# Patient Record
Sex: Female | Born: 1990 | Race: White | Hispanic: No | Marital: Single | Smoking: Never smoker
Health system: Southern US, Community
[De-identification: ages and names within clinical notes are randomized; demographics above are authoritative.]

## PROBLEM LIST (undated history)

## (undated) ENCOUNTER — Inpatient Hospital Stay (HOSPITAL_COMMUNITY): Payer: Self-pay

## (undated) DIAGNOSIS — F32A Depression, unspecified: Secondary | ICD-10-CM

## (undated) DIAGNOSIS — T7840XA Allergy, unspecified, initial encounter: Secondary | ICD-10-CM

## (undated) DIAGNOSIS — F329 Major depressive disorder, single episode, unspecified: Secondary | ICD-10-CM

## (undated) DIAGNOSIS — F419 Anxiety disorder, unspecified: Secondary | ICD-10-CM

## (undated) HISTORY — DX: Allergy, unspecified, initial encounter: T78.40XA

## (undated) HISTORY — DX: Depression, unspecified: F32.A

## (undated) HISTORY — PX: APPENDECTOMY: SHX54

## (undated) HISTORY — DX: Major depressive disorder, single episode, unspecified: F32.9

## (undated) HISTORY — DX: Anxiety disorder, unspecified: F41.9

---
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
## 2013-06-15 ENCOUNTER — Ambulatory Visit (INDEPENDENT_AMBULATORY_CARE_PROVIDER_SITE_OTHER): Payer: PRIVATE HEALTH INSURANCE | Admitting: Internal Medicine

## 2013-06-15 VITALS — BP 106/62 | HR 79 | Temp 97.9°F | Resp 16 | Ht 62.5 in | Wt 115.2 lb

## 2013-06-15 DIAGNOSIS — N912 Amenorrhea, unspecified: Secondary | ICD-10-CM

## 2013-06-15 DIAGNOSIS — F3289 Other specified depressive episodes: Secondary | ICD-10-CM

## 2013-06-15 DIAGNOSIS — F329 Major depressive disorder, single episode, unspecified: Secondary | ICD-10-CM | POA: Insufficient documentation

## 2013-06-15 LAB — POCT CBC
Granulocyte percent: 63.4 %G (ref 37–80)
HCT, POC: 39.9 % (ref 37.7–47.9)
HEMOGLOBIN: 13.4 g/dL (ref 12.2–16.2)
LYMPH, POC: 2.8 (ref 0.6–3.4)
MCH, POC: 29.6 pg (ref 27–31.2)
MCHC: 33.6 g/dL (ref 31.8–35.4)
MCV: 88.3 fL (ref 80–97)
MID (cbc): 0.6 (ref 0–0.9)
MPV: 9.9 fL (ref 0–99.8)
PLATELET COUNT, POC: 218 10*3/uL (ref 142–424)
POC Granulocyte: 5.9 (ref 2–6.9)
POC LYMPH %: 29.7 % (ref 10–50)
POC MID %: 6.9 % (ref 0–12)
RBC: 4.52 M/uL (ref 4.04–5.48)
RDW, POC: 12.4 %
WBC: 9.3 10*3/uL (ref 4.6–10.2)

## 2013-06-15 MED ORDER — PRENATAL VITAMINS 28-0.8 MG PO TABS: 1.0000 | ORAL_TABLET | Freq: Every day | ORAL | Status: DC

## 2013-06-15 NOTE — Patient Instructions (Signed)
SNRI/SSRI exposure on infant development and behavior are not known  The ACOG recommends that therapy with SSRIs or SNRIs during pregnancy be individualized; treatment of depression during pregnancy should incorporate the clinical expertise of the mental health clinician, obstetrician, primary healthc are provider, and pediatrician. According to the American Psychiatric Association (APA), the risks of medication treatment should be weighed against other treatment options and untreated depression. For women who discontinue antidepressant medications during pregnancy and who may be at high risk for postpartum depression, the medications can be restarted following delivery. Treatment algorithms have been developed by the ACOG and the APA for the management of depression in women prior to conception and during pregnancy  Health care providers are encouraged to enroll women exposed to duloxetine during pregnancy in the Cymbalta Pregnancy Registry [phone] or http://cymbaltapregnancyregistry.com). </P>

## 2013-06-15 NOTE — Progress Notes (Signed)
   Subjective:    Patient ID: Andrea Pham, female    DOB: 07/22/1990, 23 y.o.   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  Only medication is Cymbalta which she has been on for depression for 5 years No recent symptoms    Review of Systems Noncontributory    Objective:   Physical Exam BP 106/62  Pulse 79  Temp(Src) 97.9 F (36.6 C) (Oral)  Resp 16  Ht 5' 2.5" (1.588 m)  Wt 115 lb 3.2 oz (52.254 kg)  BMI 20.72 kg/m2  SpO2 98%  LMP 04/17/2013 Abdomen soft nontender without palpable mass in the suprapubic area/no tenderness to exam       Results for orders placed in visit on 06/15/13  POCT CBC      Result Value Ref Range   WBC 9.3  4.6 - 10.2 K/uL   Lymph, poc 2.8  0.6 - 3.4   POC LYMPH PERCENT 29.7  10 - 50 %L   MID (cbc) 0.6  0 - 0.9   POC MID % 6.9  0 - 12 %M   POC Granulocyte 5.9  2 - 6.9   Granulocyte percent 63.4  37 - 80 %G   RBC 4.52  4.04 - 5.48 M/uL   Hemoglobin 13.4  12.2 - 16.2 g/dL   HCT, POC 46.939.9  62.937.7 - 47.9 %   MCV 88.3  80 - 97 fL   MCH, POC 29.6  27 - 31.2 pg   MCHC 33.6  31.8 - 35.4 g/dL   RDW, POC 52.812.4     Platelet Count, POC 218  142 - 424 K/uL   MPV 9.9  0 - 99.8 fL    Assessment & Plan:  Amenorrhea - Plan: hCG, quantitative, pregnancy, POCT CBC  She would like a serum test to try to quantify her hormone and see if a date is more predictable-  She is advised that only an ultrasound would determine her dates  Prenatal vitamins started  [place]-she will decide who  Depressive disorder, not elsewhere classified  I recommend discontinuing Cymbalta at this point due to her lack of symptoms and her length of time on the    medication at such a low dose

## 2013-06-16 ENCOUNTER — Telehealth: Payer: Self-pay | Admitting: Internal Medicine

## 2013-06-16 LAB — HCG, QUANTITATIVE, PREGNANCY: hCG, Beta Chain, Quant, S: 43536.3 m[IU]/mL

## 2013-06-16 NOTE — Telephone Encounter (Signed)
hcg results conveyed---has ob appt in ws tomorrow

## 2013-06-18 ENCOUNTER — Inpatient Hospital Stay (HOSPITAL_COMMUNITY): Payer: PRIVATE HEALTH INSURANCE

## 2013-06-18 ENCOUNTER — Inpatient Hospital Stay (HOSPITAL_COMMUNITY)
Admission: AD | Admit: 2013-06-18 | Discharge: 2013-06-18 | Disposition: A | Discharge status: Home / Self Care | Payer: PRIVATE HEALTH INSURANCE | Source: Ambulatory Visit | Attending: Obstetrics and Gynecology | Admitting: Obstetrics and Gynecology

## 2013-06-18 ENCOUNTER — Encounter (HOSPITAL_COMMUNITY): Payer: Self-pay | Admitting: *Deleted

## 2013-06-18 DIAGNOSIS — O209 Hemorrhage in early pregnancy, unspecified: Secondary | ICD-10-CM

## 2013-06-18 DIAGNOSIS — R110 Nausea: Secondary | ICD-10-CM

## 2013-06-18 DIAGNOSIS — R319 Hematuria, unspecified: Secondary | ICD-10-CM | POA: Insufficient documentation

## 2013-06-18 DIAGNOSIS — R109 Unspecified abdominal pain: Secondary | ICD-10-CM | POA: Insufficient documentation

## 2013-06-18 DIAGNOSIS — O210 Mild hyperemesis gravidarum: Secondary | ICD-10-CM | POA: Insufficient documentation

## 2013-06-18 LAB — URINE MICROSCOPIC-ADD ON

## 2013-06-18 LAB — WET PREP, GENITAL
Clue Cells Wet Prep HPF POC: NONE SEEN
Trich, Wet Prep: NONE SEEN
Yeast Wet Prep HPF POC: NONE SEEN

## 2013-06-18 LAB — URINALYSIS, ROUTINE W REFLEX MICROSCOPIC
Bilirubin Urine: NEGATIVE
Glucose, UA: NEGATIVE mg/dL
Ketones, ur: NEGATIVE mg/dL
Leukocytes, UA: NEGATIVE
Nitrite: NEGATIVE
PROTEIN: NEGATIVE mg/dL
SPECIFIC GRAVITY, URINE: 1.02 (ref 1.005–1.030)
Urobilinogen, UA: 0.2 mg/dL (ref 0.0–1.0)
pH: 5.5 (ref 5.0–8.0)

## 2013-06-18 LAB — HCG, QUANTITATIVE, PREGNANCY: hCG, Beta Chain, Quant, S: 46942 m[IU]/mL — ABNORMAL HIGH (ref <5)

## 2013-06-18 MED ORDER — PROMETHAZINE HCL 25 MG PO TABS
25.0000 mg | ORAL_TABLET | Freq: Once | ORAL | Status: AC
  Administered 2013-06-18: 25 mg via ORAL
  Filled 2013-06-18: qty 1

## 2013-06-18 MED ORDER — PROMETHAZINE HCL 25 MG PO TABS: 25.0000 mg | ORAL_TABLET | Freq: Four times a day (QID) | ORAL | Status: DC | PRN

## 2013-06-18 NOTE — MAU Note (Signed)
Pt presents with complaints of light amount of vaginal bleeding that is pink in color that started today around 5 today. States some mild cramping.

## 2013-06-18 NOTE — MAU Provider Note (Signed)
History     CSN: [id number]  Arrival date and time: 06/18/13 [id number]   First Provider Initiated Contact with Patient 06/18/13 1846      Chief Complaint  Patient presents with  . Vaginal Bleeding   HPI Comments: Andrea Pham Andrea Pham 23 y.o. G1P0 presents to MAU with cramping and vaginal bleeding starting today. She noticed it in her under ware. She went to urgent Care on 3/24 and BHCG was 43,536. She did have large blood in urine and no culture was done and she does not have any sx of UTI. She is complaining of nausea. She was put on Diclegis and it is not helping.    Vaginal Bleeding Associated symptoms include abdominal pain and nausea.      Past Medical History  Diagnosis Date  . Allergy   . Anxiety   . Depression     Past Surgical History  Procedure Laterality Date  . Appendectomy      Family History  Problem Relation Age of Onset  . Heart disease Father     History  Substance Use Topics  . Smoking status: Never Smoker   . Smokeless tobacco: Not on file  . Alcohol Use: No    Allergies: No Known Allergies  Prescriptions prior to admission  Medication Sig Dispense Refill  . DULoxetine (CYMBALTA) 30 MG capsule Take 30 mg by mouth daily.      . Prenatal Vit-Fe Fumarate-FA (PRENATAL VITAMINS) 28-0.8 MG TABS Take 1 tablet by mouth daily.  30 tablet  9    Review of Systems  Constitutional: Negative.   HENT: Negative.   Eyes: Negative.   Cardiovascular: Negative.   Gastrointestinal: Positive for nausea and abdominal pain.  Genitourinary: Negative.        Cramping and vaginal bleeding  Musculoskeletal: Negative.   Skin: Negative.   Neurological: Negative.   Psychiatric/Behavioral: Negative.    Physical Exam   Blood pressure 106/57, pulse 74, temperature 98.9 F (37.2 C), resp. rate 18, height 5\' 2"  (1.575 m), weight 118 lb (53.524 kg), last menstrual period 04/08/2013.  Physical Exam  Constitutional: She is oriented to person, place, and time. She  appears well-developed and well-nourished. No distress.  HENT:  Head: Normocephalic.  Eyes: Pupils are equal, round, and reactive to light.  Neck: Normal range of motion.  Cardiovascular: Normal rate, regular rhythm and normal heart sounds.   Respiratory: Effort normal and breath sounds normal.  GI: Soft. Bowel sounds are normal. She exhibits no distension and no mass. There is no tenderness. There is no rebound and no guarding.  Genitourinary:  Genital:external negative Vaginal: small amount brown blood Cervix:closed/ long Bimanual:nontender uterus   Musculoskeletal: Normal range of motion.  Neurological: She is alert and oriented to person, place, and time.  Skin: Skin is warm and dry.  Psychiatric: She has a normal mood and affect. Her behavior is normal. Judgment and thought content normal.   Results for orders placed during the hospital encounter of 06/18/13 (from the past 24 hour(s))  URINALYSIS, ROUTINE W REFLEX MICROSCOPIC     Status: Abnormal   Collection Time    06/18/13  6:15 PM      Result Value Ref Range   Color, Urine YELLOW  YELLOW   APPearance CLEAR  CLEAR   Specific Gravity, Urine 1.020  1.005 - 1.030   pH 5.5  5.0 - 8.0   Glucose, UA NEGATIVE  NEGATIVE mg/dL   Hgb urine dipstick LARGE (*) NEGATIVE   Bilirubin Urine NEGATIVE  NEGATIVE   Ketones, ur NEGATIVE  NEGATIVE mg/dL   Protein, ur NEGATIVE  NEGATIVE mg/dL   Urobilinogen, UA 0.2  0.0 - 1.0 mg/dL   Nitrite NEGATIVE  NEGATIVE   Leukocytes, UA NEGATIVE  NEGATIVE  URINE MICROSCOPIC-ADD ON     Status: Abnormal   Collection Time    06/18/13  6:15 PM      Result Value Ref Range   Squamous Epithelial / LPF FEW (*) RARE   WBC, UA 0-2  <3 WBC/hpf   RBC / HPF 7-10  <3 RBC/hpf   Bacteria, UA FEW (*) RARE  WET PREP, GENITAL     Status: Abnormal   Collection Time    06/18/13  7:01 PM      Result Value Ref Range   Yeast Wet Prep HPF POC NONE SEEN  NONE SEEN   Trich, Wet Prep NONE SEEN  NONE SEEN   Clue Cells  Wet Prep HPF POC NONE SEEN  NONE SEEN   WBC, Wet Prep HPF POC MODERATE (*) NONE SEEN  HCG, QUANTITATIVE, PREGNANCY     Status: Abnormal   Collection Time    06/18/13  7:08 PM      Result Value Ref Range   hCG, Beta Chain, Sharene Butters, [place] [address] (*) <5 mIU/mL  ABO/RH     Status: None   Collection Time    06/18/13  7:08 PM      Result Value Ref Range   ABO/RH(D) O POS     US Ob Comp Less 14 Wks  06/18/2013   CLINICAL DATA:  Pregnant, vaginal bleeding  EXAM: OBSTETRIC <14 WK ULTRASOUND  TECHNIQUE: Transabdominal ultrasound was performed for evaluation of the gestation as well as the maternal uterus and adnexal regions.  COMPARISON:  None.  FINDINGS: Intrauterine gestational sac: Visualized/normal in shape.  Yolk sac:  Present  Embryo:  Present  Cardiac Activity: Present  Heart Rate: 130 bpm  CRL:   4.7  mm   6 w 2 d                  [place] EDC: 02/09/2014  Maternal uterus/adnexae: No subchorionic hemorrhage.  Bilateral ovaries are within normal limits, noting a suspected small left corpus luteal cyst.  No pelvic fluid.  IMPRESSION: Single live intrauterine gestation with estimated gestational age [redacted] weeks 2 days by crown-rump length.   Electronically Signed   By: Charline Bills M.D.   On: 06/18/2013 19:49    MAU Course  Procedures  MDM Wet prep, GC, Chlamydia, , UA, U/S, ABORh, Quant Spoke with Dr Marcelle Overlie and advised to give miscarriage precautions   Assessment and Plan   Bleeding in early pregnancy Nausea in pregnancy Phenergan 25 mg po q 6 hours prn nausea Follow up in office as indicated  [name] 06/18/2013, 7:06 PM

## 2013-06-18 NOTE — Discharge Instructions (Signed)
Nausea and Vomiting Nausea means you feel sick to your stomach. Throwing up (vomiting) is a reflex where stomach contents come out of your mouth. HOME CARE   Take medicine as told by your doctor.  Do not force yourself to eat. However, you do need to drink fluids.  If you feel like eating, eat a normal diet as told by your doctor.  Eat rice, wheat, potatoes, bread, lean meats, yogurt, fruits, and vegetables.  Avoid high-fat foods.  Drink enough fluids to keep your pee (urine) clear or pale yellow.  Ask your doctor how to replace body fluid losses (rehydrate). Signs of body fluid loss (dehydration) include:  Feeling very thirsty.  Dry lips and mouth.  Feeling dizzy.  Dark pee.  Peeing less than normal.  Feeling confused.  Fast breathing or heart rate. GET HELP RIGHT AWAY IF:   You have blood in your throw up.  You have black or bloody poop (stool).  You have a bad headache or stiff neck.  You feel confused.  You have bad belly (abdominal) pain.  You have chest pain or trouble breathing.  You do not pee at least once every 8 hours.  You have cold, clammy skin.  You keep throwing up after 24 to 48 hours.  You have a fever. MAKE SURE YOU:   Understand these instructions.  Will watch your condition.  Will get help right away if you are not doing well or get worse. Document Released: 08/28/2007 Document Revised: 06/03/2011 Document Reviewed: 08/10/2010 [facility] Patient Information 2014 [place]. Miscarriage A miscarriage is the sudden loss of an unborn baby (fetus) before the 20th week of pregnancy. Most miscarriages happen in the first 3 months of pregnancy. Sometimes, it happens before a woman even knows she is pregnant. A miscarriage is also called a "spontaneous miscarriage" or "early pregnancy loss." Having a miscarriage can be an emotional experience. Talk with your caregiver about any questions you may have about miscarrying, the grieving  process, and your future pregnancy plans. CAUSES   Problems with the fetal chromosomes that make it impossible for the baby to develop normally. Problems with the baby's genes or chromosomes are most often the result of errors that occur, by chance, as the embryo divides and grows. The problems are not inherited from the parents.  Infection of the cervix or uterus.   Hormone problems.   Problems with the cervix, such as having an incompetent cervix. This is when the tissue in the cervix is not strong enough to hold the pregnancy.   Problems with the uterus, such as an abnormally shaped uterus, uterine fibroids, or congenital abnormalities.   Certain medical conditions.   Smoking, drinking alcohol, or taking illegal drugs.   Trauma.  Often, the cause of a miscarriage is unknown.  SYMPTOMS   Vaginal bleeding or spotting, with or without cramps or pain.  Pain or cramping in the abdomen or lower back.  Passing fluid, tissue, or blood clots from the vagina. DIAGNOSIS  Your caregiver will perform a physical exam. You may also have an ultrasound to confirm the miscarriage. Blood or urine tests may also be ordered. TREATMENT   Sometimes, treatment is not necessary if you naturally pass all the fetal tissue that was in the uterus. If some of the fetus or placenta remains in the body (incomplete miscarriage), tissue left behind may become infected and must be removed. Usually, a dilation and curettage (D and C) procedure is performed. During a D and C procedure,  the cervix is widened (dilated) and any remaining fetal or placental tissue is gently removed from the uterus.  Antibiotic medicines are prescribed if there is an infection. Other medicines may be given to reduce the size of the uterus (contract) if there is a lot of bleeding.  If you have Rh negative blood and your baby was Rh positive, you will need a Rh immunoglobulin shot. This shot will protect any future baby from having  Rh blood problems in future pregnancies. HOME CARE INSTRUCTIONS   Your caregiver may order bed rest or may allow you to continue light activity. Resume activity as directed by your caregiver.  Have someone help with home and family responsibilities during this time.   Keep track of the number of sanitary pads you use each day and how soaked (saturated) they are. Write down this information.   Do not use tampons. Do not douche or have sexual intercourse until approved by your caregiver.   Only take over-the-counter or prescription medicines for pain or discomfort as directed by your caregiver.   Do not take aspirin. Aspirin can cause bleeding.   Keep all follow-up appointments with your caregiver.   If you or your partner have problems with grieving, talk to your caregiver or seek counseling to help cope with the pregnancy loss. Allow enough time to grieve before trying to get pregnant again.  SEEK IMMEDIATE MEDICAL CARE IF:   You have severe cramps or pain in your back or abdomen.  You have a fever.  You pass large blood clots (walnut-sized or larger) ortissue from your vagina. Save any tissue for your caregiver to inspect.   Your bleeding increases.   You have a thick, bad-smelling vaginal discharge.  You become lightheaded, weak, or you faint.   You have chills.  MAKE SURE YOU:  Understand these instructions.  Will watch your condition.  Will get help right away if you are not doing well or get worse. Document Released: 09/04/2000 Document Revised: 07/06/2012 Document Reviewed: 04/30/2011 [facility] Patient Information 2014 [place], [place].

## 2013-06-19 LAB — ABO/RH: ABO/RH(D): O POS

## 2013-06-19 LAB — GC/CHLAMYDIA PROBE AMP
CT PROBE, AMP APTIMA: NEGATIVE
GC Probe RNA: NEGATIVE

## 2013-06-20 LAB — URINE CULTURE

## 2014-01-24 ENCOUNTER — Encounter (HOSPITAL_COMMUNITY): Payer: Self-pay | Admitting: *Deleted

## 2014-04-23 ENCOUNTER — Encounter (HOSPITAL_COMMUNITY): Payer: Self-pay | Admitting: *Deleted

## 2014-10-27 IMAGING — US US OB COMP LESS 14 WK
1 series · 14 of 26 positions shown · non-contrast
Comparison: None.

CLINICAL DATA: Pregnant, vaginal bleeding

EXAM:
OBSTETRIC <14 WK ULTRASOUND
TECHNIQUE: Transabdominal ultrasound was performed for evaluation of the
gestation as well as the maternal uterus and adnexal regions.

[Series 1: us ob comp less 14 wks · 26 acquisitions, 14 frames shown]
[im 1/26]
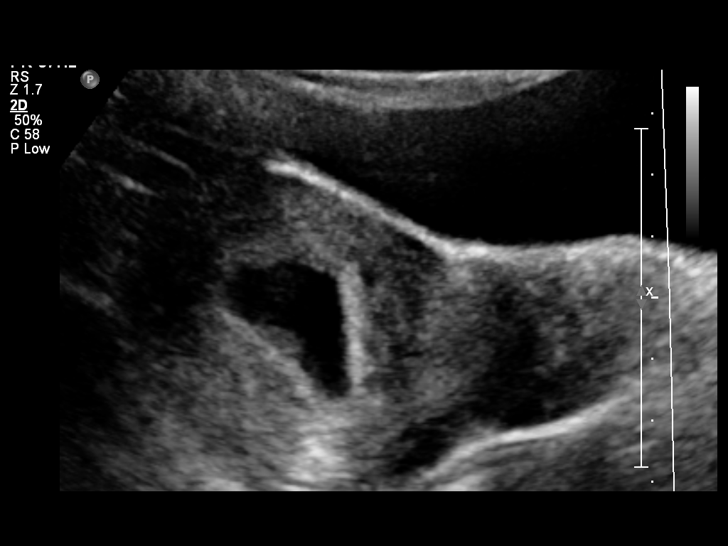
[im 3/26]
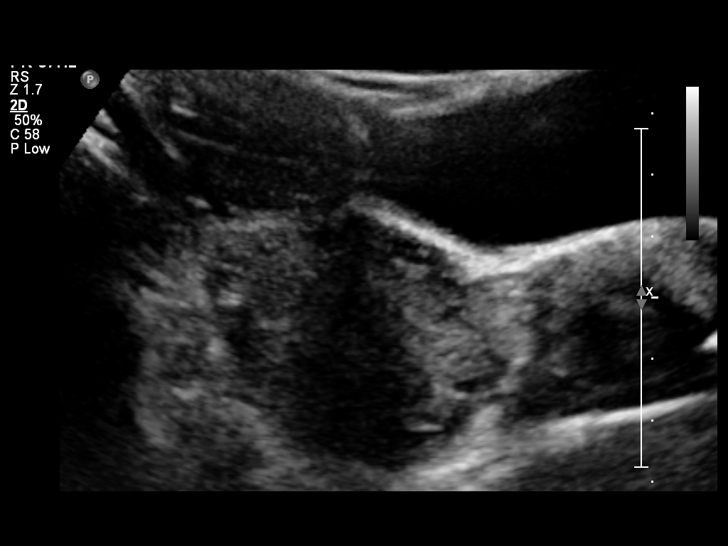
[im 5/26]
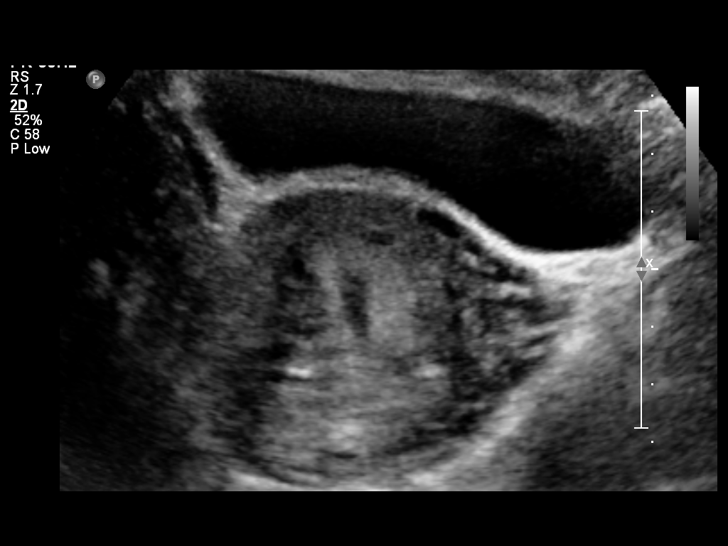
[im 7/26]
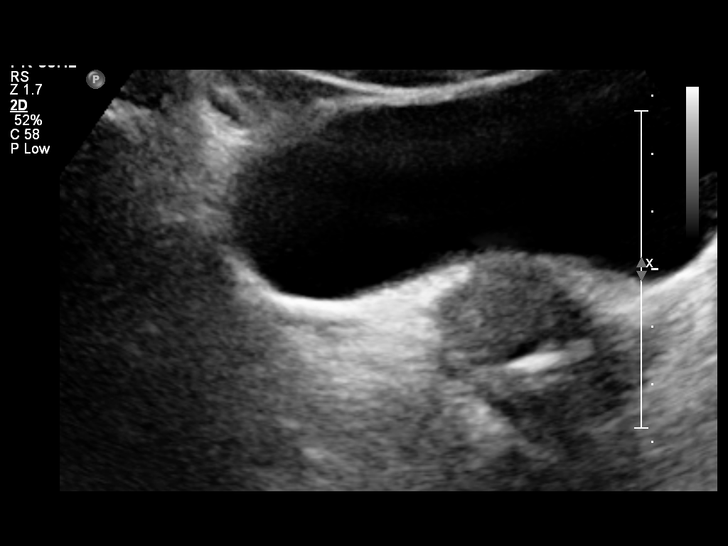
[im 9/26]
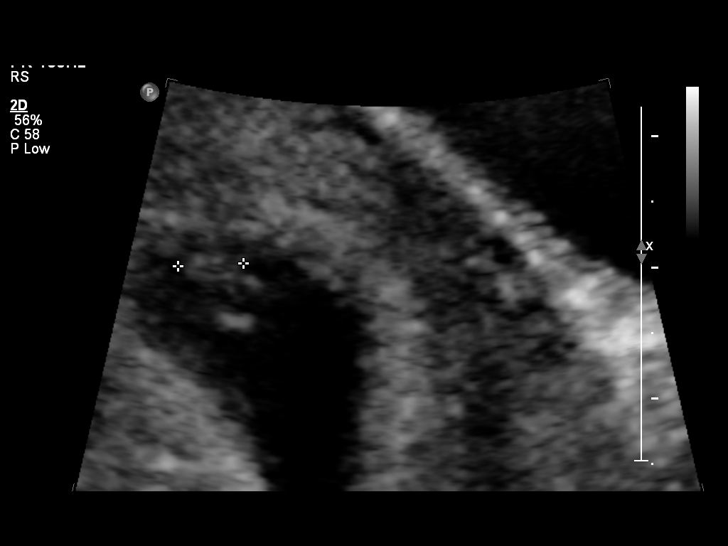
[im 11/26]
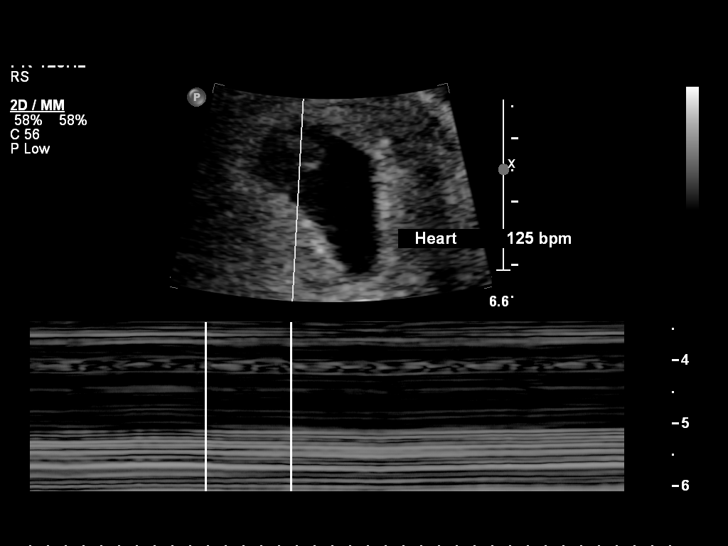
[im 13/26]
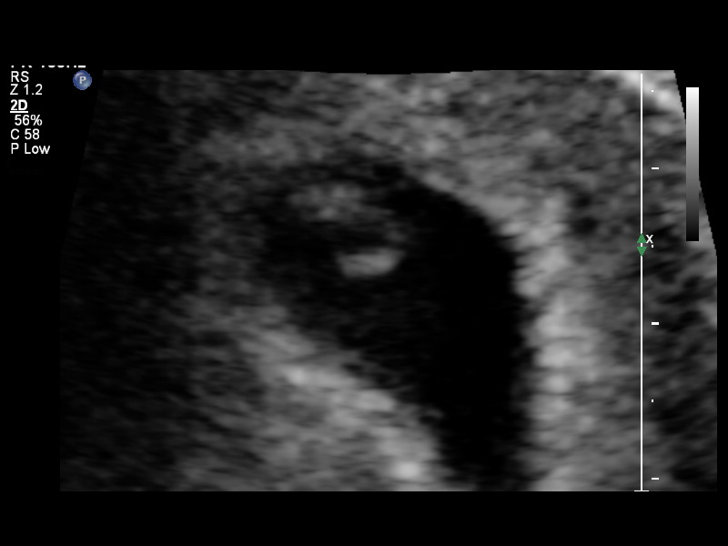
[im 14/26]
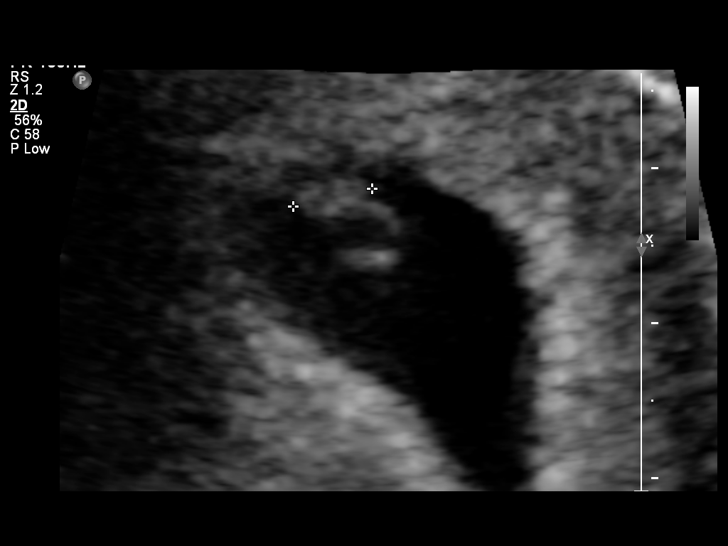
[im 16/26]
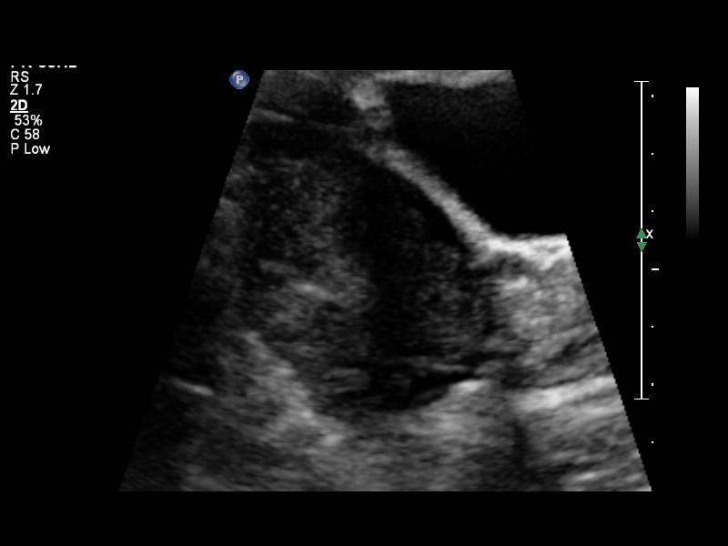
[im 18/26]
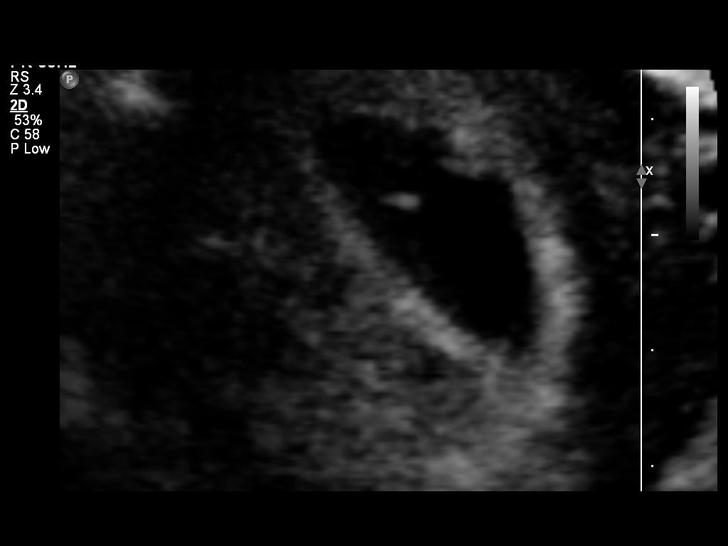
[im 20/26]
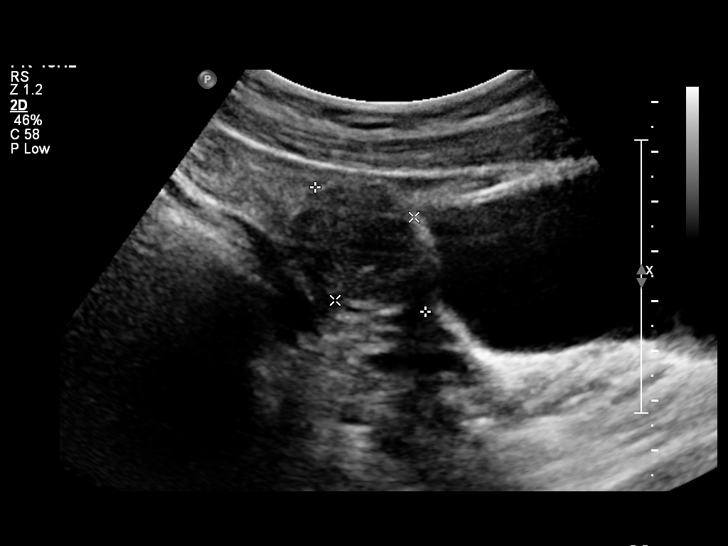
[im 22/26]
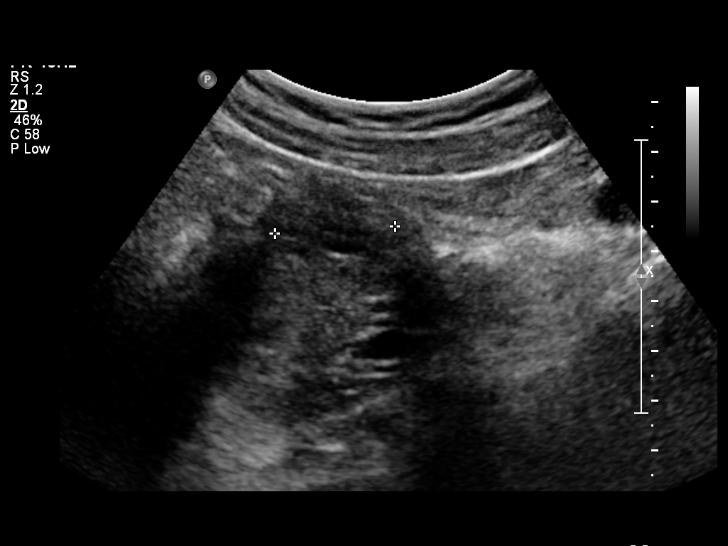
[im 24/26]
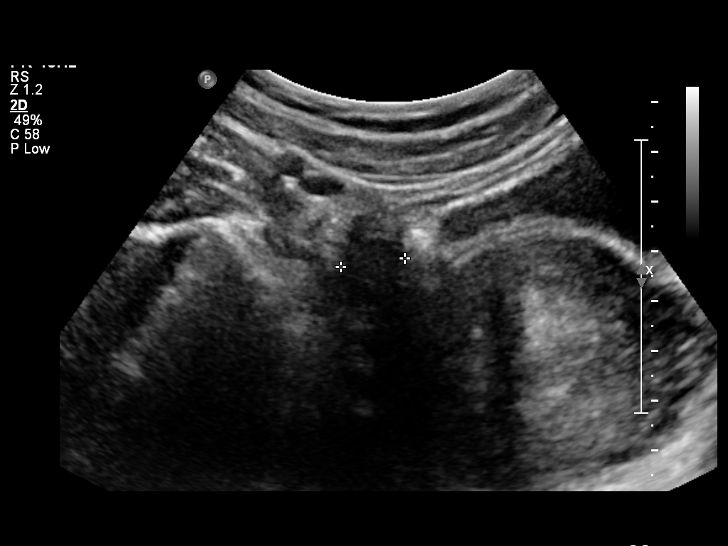
[im 26/26]
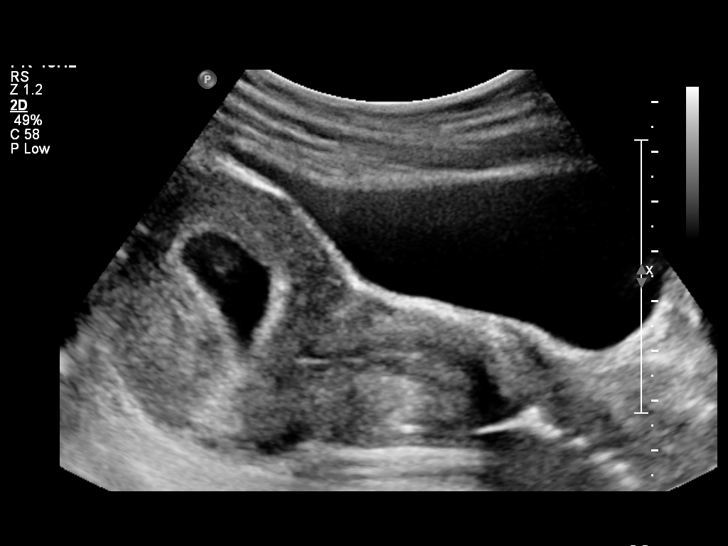

[14 of 26 positions shown; findings below may reference images not displayed]

FINDINGS: Intrauterine gestational sac: Visualized/normal in shape.

Yolk sac:  Present

Embryo:  Present

Cardiac Activity: Present

Heart Rate: 130 bpm

CRL:   4.7  mm   6 w 2 d                  US EDC: 02/09/2014

Maternal uterus/adnexae: No subchorionic hemorrhage.

Bilateral ovaries are within normal limits, noting a suspected small
left corpus luteal cyst.

No pelvic fluid.
IMPRESSION: Single live intrauterine gestation with estimated gestational age 6
weeks 2 days by crown-rump length.

## 2015-07-11 ENCOUNTER — Ambulatory Visit (INDEPENDENT_AMBULATORY_CARE_PROVIDER_SITE_OTHER): Payer: PRIVATE HEALTH INSURANCE | Admitting: Physician Assistant

## 2015-07-11 VITALS — BP 98/64 | HR 68 | Temp 98.6°F | Resp 16 | Ht 63.0 in | Wt 113.0 lb

## 2015-07-11 DIAGNOSIS — Z7184 Encounter for health counseling related to travel: Secondary | ICD-10-CM

## 2015-07-11 DIAGNOSIS — Z7189 Other specified counseling: Secondary | ICD-10-CM | POA: Diagnosis not present

## 2015-07-11 MED ORDER — DOXYCYCLINE HYCLATE 100 MG PO TABS: 100.0000 mg | ORAL_TABLET | Freq: Every day | ORAL | Status: AC

## 2015-07-11 NOTE — Patient Instructions (Addendum)
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 Please contact [facility] at [phone] with questions or concerns regarding your invoice.   IF you received labwork today, you will receive an invoice from [facility]. Please contact [facility] at [phone] with questions or concerns regarding your invoice.   Our billing staff will not be able to assist you with questions regarding bills from these companies.  You will be contacted with the lab results as soon as they are available. The fastest way to get your results is to activate your My Chart account. Instructions are located on the last page of this paperwork. If you have not heard from us regarding the results in 2 weeks, please contact this office.

## 2015-07-11 NOTE — Progress Notes (Addendum)
   Andrea Pham  MRN: [id number] DOB: 03/20/1991  Subjective:  Pt presents to clinic for medical advice on travel immunization - She is leaving in 2 days for [place] to visit her boyfriend - she is going to be there for about 3 months - she is UTD on her vaccines - [place] [employer] [place] [employer] [place].  They are going to be traveling around and plan on doing some outdoor trips while she is there.  Patient Active Problem List   Diagnosis Date Noted  . Depressive disorder, not elsewhere classified 06/15/2013    No current outpatient prescriptions on file prior to visit.   No current facility-administered medications on file prior to visit.    Allergies  Allergen Reactions  . Reglan [Metoclopramide]     Review of Systems Objective:  BP 98/64 mmHg  Pulse 68  Temp(Src) 98.6 F (37 C)  Resp 16  Ht 5\' 3"  (1.6 m)  Wt 113 lb (51.256 kg)  BMI 20.02 kg/m2  SpO2 99%  LMP 06/20/2015 (Approximate)  Physical Exam  Constitutional: She is oriented to person, place, and time and well-developed, well-nourished, and in no distress.  HENT:  Head: Normocephalic and atraumatic.  Right Ear: Hearing and external ear normal.  Left Ear: Hearing and external ear normal.  Eyes: Conjunctivae are normal.  Neck: Normal range of motion.  Pulmonary/Chest: Effort normal.  Neurological: She is alert and oriented to person, place, and time. Gait normal.  Skin: Skin is warm and dry.  Psychiatric: Mood, memory, affect and judgment normal.  Vitals reviewed.   Assessment and Plan :  Travel advice encounter - Plan: doxycycline (VIBRA-TABS) 100 MG tablet   Review CDC guidelines for travel vaccines- she is not really sure where she will be going so the below are the recommendations including malaria prophylaxis with doxy due to resistance in the area report on the CDC.  D/w pt how to take Doxy and what to expect when taking the medication.  D/w pt that [facility] other [facility].  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

## 2018-12-01 ENCOUNTER — Telehealth: Payer: Self-pay | Admitting: *Deleted

## 2018-12-01 NOTE — Telephone Encounter (Signed)
Pt called requesting information regarding how to get the covid-19 test. Advised on location in [place] at [facility], where she could get tested. Clarified telephone number with her. She did not want to update her address.  Also sent link to activate MyChart.

## 2018-12-01 NOTE — Telephone Encounter (Signed)
duplicate

## 2019-03-23 ENCOUNTER — Ambulatory Visit: Payer: BLUE CROSS/BLUE SHIELD | Attending: Internal Medicine

## 2019-03-23 DIAGNOSIS — Z20822 Contact with and (suspected) exposure to covid-19: Secondary | ICD-10-CM

## 2019-03-24 LAB — NOVEL CORONAVIRUS, NAA: SARS-CoV-2, NAA: NOT DETECTED

## 2019-08-25 ENCOUNTER — Ambulatory Visit (HOSPITAL_COMMUNITY)
Admission: EM | Admit: 2019-08-25 | Discharge: 2019-08-25 | Disposition: A | Discharge status: Home / Self Care | Payer: No Typology Code available for payment source | Attending: Emergency Medicine | Admitting: Emergency Medicine

## 2019-08-25 ENCOUNTER — Emergency Department (HOSPITAL_COMMUNITY)
Admission: EM | Admit: 2019-08-25 | Discharge: 2019-08-25 | Disposition: A | Discharge status: Home / Self Care | Payer: BLUE CROSS/BLUE SHIELD | Attending: Emergency Medicine | Admitting: Emergency Medicine

## 2019-08-25 ENCOUNTER — Other Ambulatory Visit: Payer: Self-pay

## 2019-08-25 ENCOUNTER — Encounter (HOSPITAL_COMMUNITY): Payer: Self-pay

## 2019-08-25 DIAGNOSIS — Z0441 Encounter for examination and observation following alleged adult rape: Secondary | ICD-10-CM | POA: Diagnosis not present

## 2019-08-25 DIAGNOSIS — T7421XA Adult sexual abuse, confirmed, initial encounter: Secondary | ICD-10-CM
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
## 2019-08-25 NOTE — Discharge Instructions (Signed)
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
## 2019-08-25 NOTE — SANE Note (Signed)
PATIENT MAY EAT AND/OR DRINK.  IF PATIENT USES THE RESTROOM, THEN SAVE TOILET TISSUE IN PATIENT'S ED ROOM.  FORENSIC NURSE EXAMINER/SANE WILL BE IN TO SEE THE PATIENT IN APPROXIMATELY 45-60 MINUTES.
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
## 2019-09-10 ENCOUNTER — Ambulatory Visit: Payer: BLUE CROSS/BLUE SHIELD
